# Patient Record
Sex: Male | Born: 1995 | Race: Black or African American | Hispanic: No | Marital: Single | Smoking: Current some day smoker
Health system: Southern US, Community
[De-identification: ages and names within clinical notes are randomized; demographics above are authoritative.]

---

## 2020-04-05 ENCOUNTER — Encounter (HOSPITAL_COMMUNITY): Payer: Self-pay

## 2020-04-05 ENCOUNTER — Ambulatory Visit (HOSPITAL_COMMUNITY)
Admission: EM | Admit: 2020-04-05 | Discharge: 2020-04-05 | Disposition: A | Discharge status: Home / Self Care | Payer: BC Managed Care – PPO | Attending: Urgent Care | Admitting: Urgent Care

## 2020-04-05 ENCOUNTER — Other Ambulatory Visit: Payer: Self-pay

## 2020-04-05 DIAGNOSIS — R369 Urethral discharge, unspecified: Secondary | ICD-10-CM | POA: Insufficient documentation

## 2020-04-05 LAB — HIV ANTIBODY (ROUTINE TESTING W REFLEX): HIV Screen 4th Generation wRfx: NONREACTIVE

## 2020-04-05 MED ORDER — CEFTRIAXONE SODIUM 500 MG IJ SOLR
INTRAMUSCULAR | Status: AC
  Filled 2020-04-05: qty 500

## 2020-04-05 MED ORDER — CEFTRIAXONE SODIUM 500 MG IJ SOLR
500.0000 mg | Freq: Once | INTRAMUSCULAR | Status: AC
  Administered 2020-04-05: 500 mg via INTRAMUSCULAR

## 2020-04-05 MED ORDER — AZITHROMYCIN 250 MG PO TABS
ORAL_TABLET | ORAL | Status: AC
  Filled 2020-04-05: qty 4

## 2020-04-05 MED ORDER — AZITHROMYCIN 250 MG PO TABS
1000.0000 mg | ORAL_TABLET | Freq: Once | ORAL | Status: AC
  Administered 2020-04-05: 1000 mg via ORAL

## 2020-04-05 MED ORDER — LIDOCAINE HCL (PF) 1 % IJ SOLN
INTRAMUSCULAR | Status: AC
  Filled 2020-04-05: qty 2

## 2020-04-05 NOTE — ED Provider Notes (Signed)
  [facility]   MRN: [id number] DOB: 1995/11/26  Subjective:   Jeffrey Mayer is a 24 y.o. male presenting for 2-week history of persistent penile discharge.  Patient is sexually active, does not use condoms for protection.  He would like to have a complete STI check.  Denies any history of STIs.  He is not currently taking any medications and has no known food or drug allergies.  Denies past medical and surgical history.   Family History  Family history unknown: Yes    Social History   Tobacco Use  . Smoking status: Current Some Day Smoker  Substance Use Topics  . Alcohol use: Not on file  . Drug use: Not on file    ROS   Objective:   Vitals: BP 117/63 (BP Location: Right Arm)   Pulse 63   Temp 98.2 F (36.8 C) (Oral)   Resp 18   SpO2 100%   Physical Exam Constitutional:      General: He is not in acute distress.    Appearance: Normal appearance. He is well-developed and normal weight. He is not ill-appearing, toxic-appearing or diaphoretic.  HENT:     Head: Normocephalic and atraumatic.     Right Ear: External ear normal.     Left Ear: External ear normal.     Nose: Nose normal.     Mouth/Throat:     Pharynx: Oropharynx is clear.  Eyes:     General: No scleral icterus.       Right eye: No discharge.        Left eye: No discharge.     Extraocular Movements: Extraocular movements intact.     Pupils: Pupils are equal, round, and reactive to light.  Cardiovascular:     Rate and Rhythm: Normal rate.  Pulmonary:     Effort: Pulmonary effort is normal.  Genitourinary:    Penis: Discharge present.   Musculoskeletal:     Cervical back: Normal range of motion.  Skin:    General: Skin is warm and dry.  Neurological:     Mental Status: He is alert and oriented to person, place, and time.  Psychiatric:        Mood and Affect: Mood normal.        Behavior: Behavior normal.        Thought Content: Thought content normal.        Judgment: Judgment normal.        Assessment and Plan :   PDMP not reviewed this encounter.  1. Penile discharge     Empiric treatment with ceftriaxone, azithromycin in clinic.  This ensures compliance with medication.  Labs pending.  Recommended patient abstain for 1 week. Counseled patient on potential for adverse effects with medications prescribed/recommended today, ER and return-to-clinic precautions discussed, patient verbalized understanding.    Wallis Bamberg, [place] 04/05/20 1117

## 2020-04-05 NOTE — Discharge Instructions (Signed)
Avoid all forms of sexual intercourse (oral, vaginal, anal) for the next 7 days to avoid spreading/reinfecting. Return if symptoms worsen/do not resolve, you develop fever, abdominal pain, blood in your urine, or are re-exposed to an STI.  

## 2020-04-05 NOTE — ED Triage Notes (Signed)
Pt presents with penile discharge X 2 weeks.

## 2020-04-06 LAB — RPR: RPR Ser Ql: NONREACTIVE

## 2020-04-09 LAB — CYTOLOGY, (ORAL, ANAL, URETHRAL) ANCILLARY ONLY
Chlamydia: NEGATIVE
Comment: NEGATIVE
Comment: NEGATIVE
Comment: NORMAL
Neisseria Gonorrhea: POSITIVE — AB
Trichomonas: NEGATIVE

## 2020-04-10 ENCOUNTER — Telehealth (HOSPITAL_COMMUNITY): Payer: Self-pay | Admitting: Emergency Medicine

## 2020-04-10 NOTE — Telephone Encounter (Signed)
Called patient to inform him of positive Gonorrhea result.  Treated in office on date of visit.  Encouraged patient to notify any sexual partners, to refrain from sexual intercourse until 7 days after treatment, and to use condoms for protection in the future.  Patient verbalized understanding.

## 2022-01-18 ENCOUNTER — Emergency Department (HOSPITAL_COMMUNITY): Payer: 59

## 2022-01-18 ENCOUNTER — Emergency Department (HOSPITAL_COMMUNITY)
Admission: EM | Admit: 2022-01-18 | Discharge: 2022-01-18 | Disposition: A | Discharge status: Home / Self Care | Payer: 59 | Attending: Emergency Medicine | Admitting: Emergency Medicine

## 2022-01-18 DIAGNOSIS — F10120 Alcohol abuse with intoxication, uncomplicated: Secondary | ICD-10-CM | POA: Insufficient documentation

## 2022-01-18 DIAGNOSIS — R109 Unspecified abdominal pain: Secondary | ICD-10-CM | POA: Diagnosis not present

## 2022-01-18 DIAGNOSIS — S0232XA Fracture of orbital floor, left side, initial encounter for closed fracture: Secondary | ICD-10-CM | POA: Diagnosis not present

## 2022-01-18 DIAGNOSIS — S80212A Abrasion, left knee, initial encounter: Secondary | ICD-10-CM | POA: Insufficient documentation

## 2022-01-18 DIAGNOSIS — E876 Hypokalemia: Secondary | ICD-10-CM | POA: Insufficient documentation

## 2022-01-18 DIAGNOSIS — S02401A Maxillary fracture, unspecified, initial encounter for closed fracture: Secondary | ICD-10-CM | POA: Diagnosis not present

## 2022-01-18 DIAGNOSIS — S0093XA Contusion of unspecified part of head, initial encounter: Secondary | ICD-10-CM | POA: Insufficient documentation

## 2022-01-18 DIAGNOSIS — S0993XA Unspecified injury of face, initial encounter: Secondary | ICD-10-CM | POA: Diagnosis present

## 2022-01-18 DIAGNOSIS — S0240DA Maxillary fracture, left side, initial encounter for closed fracture: Secondary | ICD-10-CM

## 2022-01-18 DIAGNOSIS — F10920 Alcohol use, unspecified with intoxication, uncomplicated: Secondary | ICD-10-CM

## 2022-01-18 LAB — COMPREHENSIVE METABOLIC PANEL
ALT: 33 U/L (ref 0–44)
AST: 24 U/L (ref 15–41)
Albumin: 4.3 g/dL (ref 3.5–5.0)
Alkaline Phosphatase: 60 U/L (ref 38–126)
Anion gap: 11 (ref 5–15)
BUN: 9 mg/dL (ref 6–20)
CO2: 21 mmol/L — ABNORMAL LOW (ref 22–32)
Calcium: 9 mg/dL (ref 8.9–10.3)
Chloride: 106 mmol/L (ref 98–111)
Creatinine, Ser: 1.11 mg/dL (ref 0.61–1.24)
GFR, Estimated: >60 mL/min (ref >60)
Glucose, Bld: 111 mg/dL — ABNORMAL HIGH (ref 70–99)
Potassium: 3 mmol/L — ABNORMAL LOW (ref 3.5–5.1)
Sodium: 138 mmol/L (ref 135–145)
Total Bilirubin: 0.8 mg/dL (ref 0.3–1.2)
Total Protein: 7 g/dL (ref 6.5–8.1)

## 2022-01-18 LAB — CBC WITH DIFFERENTIAL/PLATELET
Abs Immature Granulocytes: 0.04 10*3/uL (ref 0.00–0.07)
Basophils Absolute: 0 10*3/uL (ref 0.0–0.1)
Basophils Relative: 0 %
Eosinophils Absolute: 0.1 10*3/uL (ref 0.0–0.5)
Eosinophils Relative: 1 %
HCT: 43.2 % (ref 39.0–52.0)
Hemoglobin: 15.1 g/dL (ref 13.0–17.0)
Immature Granulocytes: 0 %
Lymphocytes Relative: 33 %
Lymphs Abs: 3.1 10*3/uL (ref 0.7–4.0)
MCH: 29.6 pg (ref 26.0–34.0)
MCHC: 35 g/dL (ref 30.0–36.0)
MCV: 84.7 fL (ref 80.0–100.0)
Monocytes Absolute: 0.5 10*3/uL (ref 0.1–1.0)
Monocytes Relative: 5 %
Neutro Abs: 5.8 10*3/uL (ref 1.7–7.7)
Neutrophils Relative %: 61 %
Platelets: 326 10*3/uL (ref 150–400)
RBC: 5.1 MIL/uL (ref 4.22–5.81)
RDW: 12.9 % (ref 11.5–15.5)
WBC: 9.5 10*3/uL (ref 4.0–10.5)
nRBC: 0 % (ref 0.0–0.2)

## 2022-01-18 LAB — LIPASE, BLOOD: Lipase: 26 U/L (ref 11–51)

## 2022-01-18 MED ORDER — IOHEXOL 350 MG/ML SOLN
100.0000 mL | Freq: Once | INTRAVENOUS | Status: AC | PRN
  Administered 2022-01-18: 100 mL via INTRAVENOUS

## 2022-01-18 MED ORDER — ONDANSETRON HCL 4 MG/2ML IJ SOLN
4.0000 mg | Freq: Once | INTRAMUSCULAR | Status: AC
  Administered 2022-01-18: 4 mg via INTRAVENOUS

## 2022-01-18 MED ORDER — SODIUM CHLORIDE 0.9 % IV BOLUS (SEPSIS)
1000.0000 mL | Freq: Once | INTRAVENOUS | Status: AC
  Administered 2022-01-18: 1000 mL via INTRAVENOUS

## 2022-01-18 MED ORDER — LORAZEPAM 2 MG/ML IJ SOLN
1.0000 mg | Freq: Once | INTRAMUSCULAR | Status: AC
  Administered 2022-01-18: 1 mg via INTRAVENOUS

## 2022-01-18 MED ORDER — SODIUM CHLORIDE 0.9 % IV SOLN
1000.0000 mL | INTRAVENOUS | Status: DC
  Administered 2022-01-18: 1000 mL via INTRAVENOUS
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
## 2022-01-18 NOTE — ED Notes (Signed)
Patient transported to CT 
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
## 2022-01-18 NOTE — ED Notes (Signed)
Patient transported to CT with [facility]N 

## 2022-01-18 NOTE — ED Notes (Signed)
Patient Alert and oriented to baseline. Stable and ambulatory to baseline. Patient verbalized understanding of the discharge instructions.  Patient belongings were taken by the patient.   

## 2022-01-18 NOTE — Discharge Instructions (Addendum)
For pain control you may take at 1000 mg of Tylenol every 8 hours as needed. 
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
## 2022-02-16 ENCOUNTER — Ambulatory Visit (HOSPITAL_COMMUNITY)
Admission: EM | Admit: 2022-02-16 | Discharge: 2022-02-16 | Disposition: A | Discharge status: Home / Self Care | Payer: Self-pay | Attending: Internal Medicine | Admitting: Internal Medicine

## 2022-02-16 ENCOUNTER — Encounter (HOSPITAL_COMMUNITY): Payer: Self-pay | Admitting: Emergency Medicine

## 2022-02-16 DIAGNOSIS — R369 Urethral discharge, unspecified: Secondary | ICD-10-CM | POA: Insufficient documentation

## 2022-02-16 DIAGNOSIS — Z113 Encounter for screening for infections with a predominantly sexual mode of transmission: Secondary | ICD-10-CM | POA: Insufficient documentation

## 2022-02-16 NOTE — ED Triage Notes (Addendum)
Pt is present today with concerns for penile discharge. Pt sx started last Wednesday  ?

## 2022-02-16 NOTE — Discharge Instructions (Signed)
Your STD test is pending.  We will call if anything is positive and treat as appropriate.  Please refrain from sexual activity until test results and treatment are complete. ?

## 2022-02-16 NOTE — ED Provider Notes (Signed)
?[facility] ? ? ? ?CSN: [id number] ?Arrival date & time: 02/16/22  1147 ? ? ?  ? ?History   ?Chief Complaint ?Chief Complaint  ?Patient presents with  ? Penile Discharge  ? ? ?HPI ?Jeffrey Mayer is a 26 y.o. male.  ? ?Patient presents for penile discharge that is white to yellow in color.  Penile discharge has been present for approximately 5 days per patient.  Denies any associated dysuria, urinary frequency, testicular pain, abdominal pain, fever, back pain.  Denies any known exposure to STD but has had unprotected sexual intercourse prior to symptoms starting. ? ? ?Penile Discharge ? ? ?History reviewed. No pertinent past medical history. ? ?There are no problems to display for this patient. ? ? ?History reviewed. No pertinent surgical history. ? ? ? ? ?Home Medications   ? ?Prior to Admission medications   ?Not on File  ? ? ?Family History ?Family History  ?Problem Relation Age of Onset  ? Hypertension Father   ? ? ?Social History ?Social History  ? ?Tobacco Use  ? Smoking status: Some Days  ?Vaping Use  ? Vaping Use: Never used  ?Substance Use Topics  ? Alcohol use: Never  ? Drug use: Never  ? ? ? ?Allergies   ?Patient has no known allergies. ? ? ?Review of Systems ?Review of Systems ?Per HPI ? ?Physical Exam ?Triage Vital Signs ?ED Triage Vitals  ?Enc Vitals Group  ?   BP 02/16/22 1323 133/74  ?   Pulse Rate 02/16/22 1323 63  ?   Resp 02/16/22 1323 18  ?   Temp 02/16/22 1323 97.9 ?F (36.6 ?C)  ?   Temp Source 02/16/22 1323 Oral  ?   SpO2 02/16/22 1323 98 %  ?   Weight --   ?   Height --   ?   Head Circumference --   ?   Peak Flow --   ?   Pain Score 02/16/22 1322 0  ?   Pain Loc --   ?   Pain Edu? --   ?   Excl. in GC? --   ? ?No data found. ? ?Updated Vital Signs ?BP 133/74   Pulse 63   Temp 97.9 ?F (36.6 ?C) (Oral)   Resp 18   SpO2 98%  ? ?Visual Acuity ?Right Eye Distance:   ?Left Eye Distance:   ?Bilateral Distance:   ? ?Right Eye Near:   ?Left Eye Near:    ?Bilateral Near:     ? ?Physical Exam ?Constitutional:   ?   General: He is not in acute distress. ?   Appearance: Normal appearance. He is not toxic-appearing or diaphoretic.  ?HENT:  ?   Head: Normocephalic and atraumatic.  ?Eyes:  ?   Extraocular Movements: Extraocular movements intact.  ?   Conjunctiva/sclera: Conjunctivae normal.  ?Pulmonary:  ?   Effort: Pulmonary effort is normal.  ?Genitourinary: ?   Comments: Deferred with shared decision making.  Self swab performed. ?Neurological:  ?   General: No focal deficit present.  ?   Mental Status: He is alert and oriented to person, place, and time. Mental status is at baseline.  ?Psychiatric:     ?   Mood and Affect: Mood normal.     ?   Behavior: Behavior normal.     ?   Thought Content: Thought content normal.     ?   Judgment: Judgment normal.  ? ? ? ?UC Treatments / Results  ?  Labs ?(all labs ordered are listed, but only abnormal results are displayed) ?Labs Reviewed  ?CYTOLOGY, (ORAL, ANAL, URETHRAL) ANCILLARY ONLY  ? ? ?EKG ? ? ?Radiology ?No results found. ? ?Procedures ?Procedures (including critical care time) ? ?Medications Ordered in UC ?Medications - No data to display ? ?Initial Impression / Assessment and Plan / UC Course  ?I have reviewed the triage vital signs and the nursing notes. ? ?Pertinent labs & imaging results that were available during my care of the patient were reviewed by me and considered in my medical decision making (see chart for details). ? ?  ? ?Cytology swab pending.  Will wait for results for any further treatment at this time.  Patient to refrain from sexual activity until test results and treatment are complete.  Discussed return precautions.  Patient verbalized understanding and was agreeable with plan. ?Final Clinical Impressions(s) / UC Diagnoses  ? ?Final diagnoses:  ?Penile discharge  ?Screening examination for venereal disease  ? ? ? ?Discharge Instructions   ? ?  ?Your STD test is pending.  We will call if anything is positive and treat  as appropriate.  Please refrain from sexual activity until test results and treatment are complete. ? ? ? ?ED Prescriptions   ?None ?  ? ?PDMP not reviewed this encounter. ?  ?Gustavus Bryant, [place] ?02/16/22 1338 ? ?

## 2022-02-17 LAB — CYTOLOGY, (ORAL, ANAL, URETHRAL) ANCILLARY ONLY
Chlamydia: NEGATIVE
Comment: NEGATIVE
Comment: NEGATIVE
Comment: NORMAL
Neisseria Gonorrhea: NEGATIVE
Trichomonas: NEGATIVE

## 2022-02-23 ENCOUNTER — Encounter (HOSPITAL_COMMUNITY): Payer: Self-pay | Admitting: Emergency Medicine

## 2022-02-23 ENCOUNTER — Ambulatory Visit (HOSPITAL_COMMUNITY)
Admission: EM | Admit: 2022-02-23 | Discharge: 2022-02-23 | Disposition: A | Discharge status: Home / Self Care | Payer: 59 | Attending: Family Medicine | Admitting: Family Medicine

## 2022-02-23 DIAGNOSIS — Z113 Encounter for screening for infections with a predominantly sexual mode of transmission: Secondary | ICD-10-CM | POA: Insufficient documentation

## 2022-02-23 DIAGNOSIS — Z202 Contact with and (suspected) exposure to infections with a predominantly sexual mode of transmission: Secondary | ICD-10-CM

## 2022-02-23 LAB — HIV ANTIBODY (ROUTINE TESTING W REFLEX): HIV Screen 4th Generation wRfx: NONREACTIVE

## 2022-02-23 NOTE — ED Triage Notes (Signed)
Pt is present today for STD testing. Pt states that he would like to be tested for HIV and syphilis.

## 2022-02-23 NOTE — ED Provider Notes (Signed)
  [facility]   [id number] 02/23/22 Arrival Time: 0909  ASSESSMENT & PLAN:  1. Screening for STDs (sexually transmitted diseases)       Discharge Instructions      We have sent testing for HIV and Syphilis. We will notify you of any positive results once they are received. If required, we will prescribe any medications you might need.  Please refrain from all sexual activity until test results are available.     Pending: Labs Reviewed  HIV ANTIBODY (ROUTINE TESTING W REFLEX)  RPR    Reviewed expectations re: course of current medical issues. Questions answered. Outlined signs and symptoms indicating need for more acute intervention. Patient verbalized understanding. After Visit Summary given.   SUBJECTIVE:  Jeffrey Mayer is a 26 y.o. male who request HIV/RPR testing. Is well. Recent urethral cytology negative. No current symptoms.  OBJECTIVE:  Vitals:   02/23/22 1026  BP: 118/63  Pulse: (!) 53  Resp: 18  Temp: 98 F (36.7 C)  SpO2: 99%     General appearance: alert, cooperative, appears stated age and no distress Skin: warm and dry Psychological: alert and cooperative; normal mood and affect.  Reviewed: Results for orders placed or performed during the hospital encounter of 02/16/22  Cytology (oral, anal, urethral) ancillary only  Result Value Ref Range   Trichomonas Negative    Chlamydia Negative    Neisseria Gonorrhea Negative    Comment Normal Reference Range Trichomonas - Negative    Comment Normal Reference Ranger Chlamydia - Negative    Comment      Normal Reference Range Neisseria Gonorrhea - Negative    Labs Reviewed  HIV ANTIBODY (ROUTINE TESTING W REFLEX)  RPR    No Known Allergies  History reviewed. No pertinent past medical history. Family History  Problem Relation Age of Onset   Hypertension Father    Social History   Socioeconomic History   Marital status: Single    Spouse name: Not on file   Number  of children: Not on file   Years of education: Not on file   Highest education level: Not on file  Occupational History   Not on file  Tobacco Use   Smoking status: Some Days   Smokeless tobacco: Not on file  Vaping Use   Vaping Use: Never used  Substance and Sexual Activity   Alcohol use: Never   Drug use: Never   Sexual activity: Not on file  Other Topics Concern   Not on file  Social History Narrative   Not on file   Social Determinants of Health   Financial Resource Strain: Not on file  Food Insecurity: Not on file  Transportation Needs: Not on file  Physical Activity: Not on file  Stress: Not on file  Social Connections: Not on file  Intimate Partner Violence: Not on file           Mardella Layman, MD 02/23/22 1056

## 2022-02-23 NOTE — Discharge Instructions (Signed)
We have sent testing for HIV and Syphilis. We will notify you of any positive results once they are received. If required, we will prescribe any medications you might need.  Please refrain from all sexual activity until test results are available.

## 2022-02-24 LAB — RPR: RPR Ser Ql: NONREACTIVE
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
## 2023-09-01 ENCOUNTER — Encounter (HOSPITAL_COMMUNITY): Payer: Self-pay

## 2023-09-01 ENCOUNTER — Ambulatory Visit (HOSPITAL_COMMUNITY)
Admission: RE | Admit: 2023-09-01 | Discharge: 2023-09-01 | Disposition: A | Discharge status: Home / Self Care | Payer: 59 | Source: Ambulatory Visit | Attending: Internal Medicine | Admitting: Internal Medicine

## 2023-09-01 VITALS — BP 117/72 | HR 48 | Temp 98.7°F | Resp 16 | Ht 69.0 in | Wt 180.0 lb

## 2023-09-01 DIAGNOSIS — Z113 Encounter for screening for infections with a predominantly sexual mode of transmission: Secondary | ICD-10-CM | POA: Insufficient documentation

## 2023-09-01 NOTE — ED Triage Notes (Signed)
Pt presents to urgent care wanting to be tested for STD's Pt reports his previous sexual partner was tested and "something came back." Pt currently denies symptoms however about two days ago, pt did have discharge and lower abdominal pain that has now resolved. Pt denies taking medications for his pain/symptoms.

## 2023-09-01 NOTE — ED Provider Notes (Signed)
[facility]    CSN: [id number] Arrival date & time: 09/01/23  5009      History   Chief Complaint Chief Complaint  Patient presents with   SEXUALLY TRANSMITTED DISEASE    HPI Jeffrey Mayer Melcher is a 27 y.o. male comes to urgent care for STD screening.  Patient is sexually active.  Patient has had some penile discharge earlier in the week but that has resolved.  No scrotal pain or swelling.  No penile ulcers.  Patient is engaging in unprotected sexual intercourse.   HPI  History reviewed. No pertinent past medical history.  There are no problems to display for this patient.   History reviewed. No pertinent surgical history.     Home Medications    Prior to Admission medications   Not on File    Family History Family History  Problem Relation Age of Onset   Hypertension Father     Social History Social History   Tobacco Use   Smoking status: Some Days  Vaping Use   Vaping status: Never Used  Substance Use Topics   Alcohol use: Never   Drug use: Never     Allergies   Patient has no known allergies.   Review of Systems Review of Systems As per HPI  Physical Exam Triage Vital Signs ED Triage Vitals  Encounter Vitals Group     BP 09/01/23 0946 117/72     Systolic BP Percentile --      Diastolic BP Percentile --      Pulse Rate 09/01/23 0946 (!) 48     Resp 09/01/23 0946 16     Temp 09/01/23 0946 98.7 F (37.1 C)     Temp Source 09/01/23 0946 Oral     SpO2 09/01/23 0946 100 %     Weight 09/01/23 0945 180 lb (81.6 kg)     Height 09/01/23 0945 5\' 9"  (1.753 m)     Head Circumference --      Peak Flow --      Pain Score 09/01/23 0945 0     Pain Loc --      Pain Education --      Exclude from Growth Chart --    No data found.  Updated Vital Signs BP 117/72 (BP Location: Right Arm)   Pulse (!) 48   Temp 98.7 F (37.1 C) (Oral)   Resp 16   Ht 5\' 9"  (1.753 m)   Wt 81.6 kg   SpO2 100%   BMI 26.58 kg/m   Visual  Acuity Right Eye Distance:   Left Eye Distance:   Bilateral Distance:    Right Eye Near:   Left Eye Near:    Bilateral Near:     Physical Exam Vitals and nursing note reviewed.  Constitutional:      General: He is not in acute distress.    Appearance: He is not ill-appearing.  Eyes:     Extraocular Movements: Extraocular movements intact.     Conjunctiva/sclera: Conjunctivae normal.     Pupils: Pupils are equal, round, and reactive to light.  Genitourinary:    Comments: Deferred Skin:    General: Skin is warm.     Capillary Refill: Capillary refill takes less than 2 seconds.  Neurological:     General: No focal deficit present.     Mental Status: He is alert and oriented to person, place, and time.      UC Treatments / Results  Labs (all labs ordered  are listed, but only abnormal results are displayed) Labs Reviewed  CYTOLOGY, (ORAL, ANAL, URETHRAL) ANCILLARY ONLY    EKG   Radiology No results found.  Procedures Procedures (including critical care time)  Medications Ordered in UC Medications - No data to display  Initial Impression / Assessment and Plan / UC Course  I have reviewed the triage vital signs and the nursing notes.  Pertinent labs & imaging results that were available during my care of the patient were reviewed by me and considered in my medical decision making (see chart for details).     1.  Screening for STD: Cytology for GC/chlamydia/trichomonas Will call patient with recommendations if labs are abnormal Return precautions given Safe sex practices advised Patient is advised to abstain from sexual intercourse until lab results are available. Final Clinical Impressions(s) / UC Diagnoses   Final diagnoses:  Screen for STD (sexually transmitted disease)     Discharge Instructions      Please avoid sexual intercourse until lab results are available Safe sex practices advised that I will call you with recommendations if your labs are  abnormal.  If you see your results on MyChart and you have any concerns please feel free to call us if we have not called you. Please return to urgent care if you have any other concerns   ED Prescriptions   None    PDMP not reviewed this encounter.   Merrilee Jansky, MD 09/01/23 [phone]

## 2023-09-01 NOTE — Discharge Instructions (Signed)
Please avoid sexual intercourse until lab results are available Safe sex practices advised that I will call you with recommendations if your labs are abnormal.  If you see your results on MyChart and you have any concerns please feel free to call us if we have not called you. Please return to urgent care if you have any other concerns

## 2023-09-03 ENCOUNTER — Telehealth (HOSPITAL_COMMUNITY): Payer: Self-pay | Admitting: Emergency Medicine

## 2023-09-03 LAB — CYTOLOGY, (ORAL, ANAL, URETHRAL) ANCILLARY ONLY
Chlamydia: POSITIVE — AB
Comment: NEGATIVE
Comment: NEGATIVE
Comment: NORMAL
Neisseria Gonorrhea: NEGATIVE
Trichomonas: NEGATIVE

## 2023-09-03 MED ORDER — DOXYCYCLINE HYCLATE 100 MG PO CAPS: 100.0000 mg | ORAL_CAPSULE | Freq: Two times a day (BID) | ORAL | 0 refills | Status: AC

## 2023-09-03 NOTE — Telephone Encounter (Signed)
 Doxycycline for positive Chlamydia, per protocol
